# Patient Record
Sex: Male | Born: 2000 | Marital: Single
Health system: Northeastern US, Academic
[De-identification: ages and names within clinical notes are randomized; demographics above are authoritative.]

---
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
## 2019-03-14 IMAGING — US US SOFT TISSUE NECK BIL
1 series · 14 of 25 positions shown · non-contrast
Comparison: None.

HISTORY/INDICATIONS:  Enlarged upper neck lymph nodes.
TECHNIQUE: Gray-scale and color Doppler imaging of the bilateral anterior neck is performed.

[Series 1: us soft tissue neck bil · 14 of 26 slices shown]
[im 1/26]
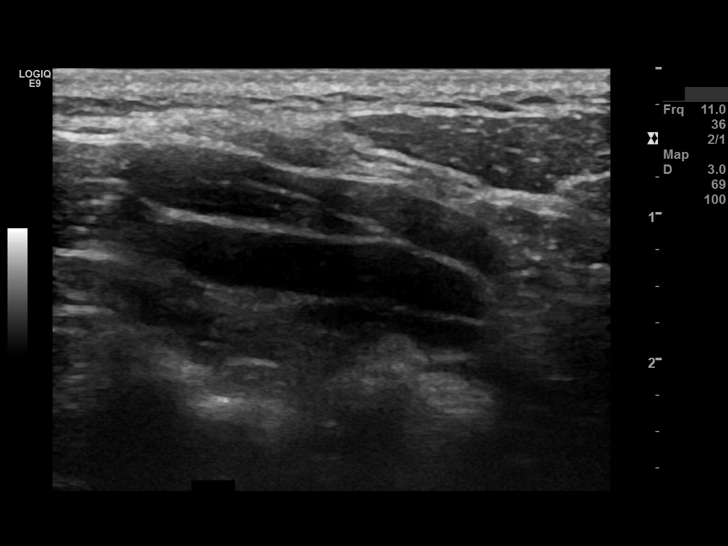
[im 3/26]
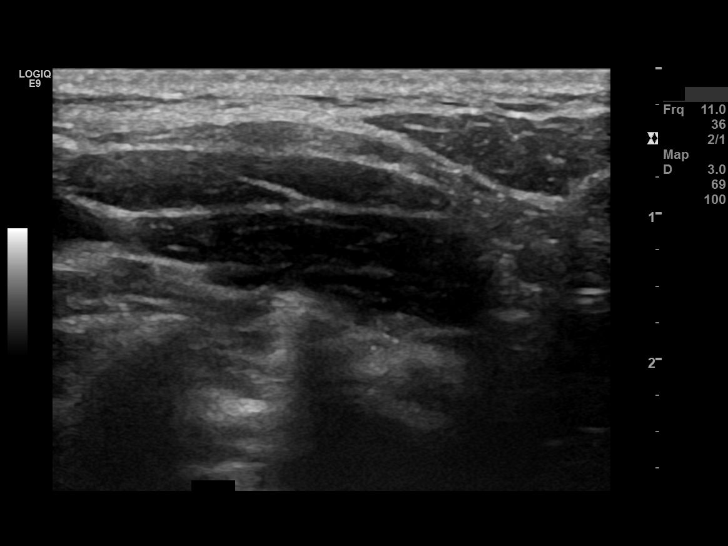
[im 5/26]
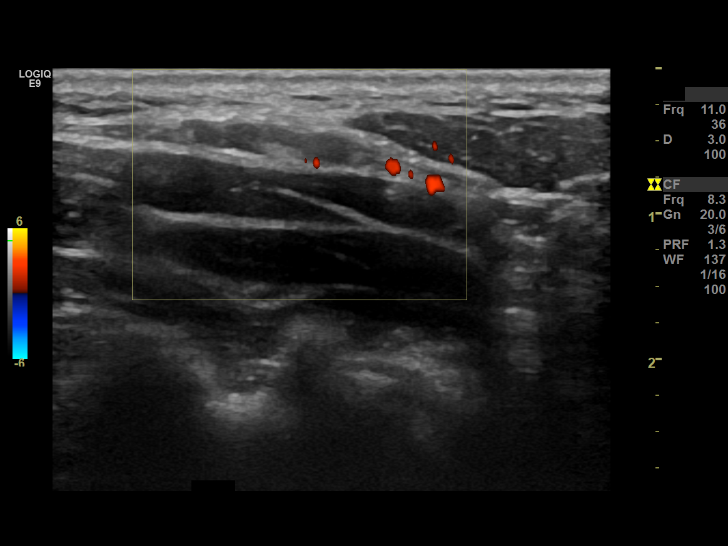
[im 7/26]
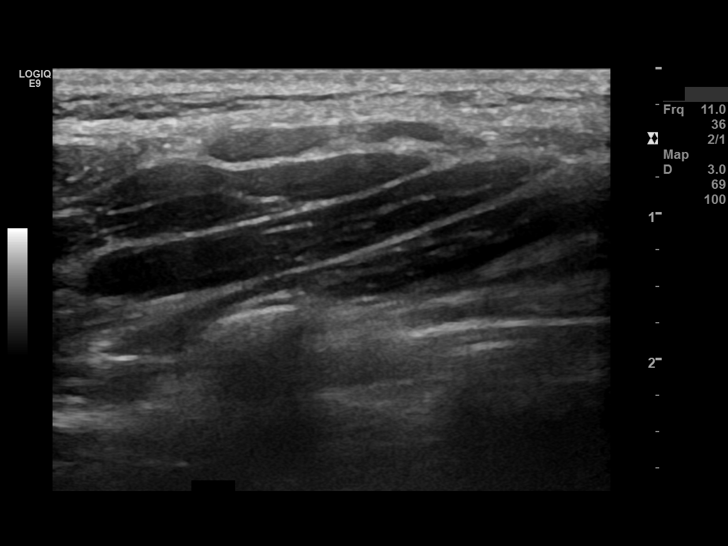
[im 9/26]
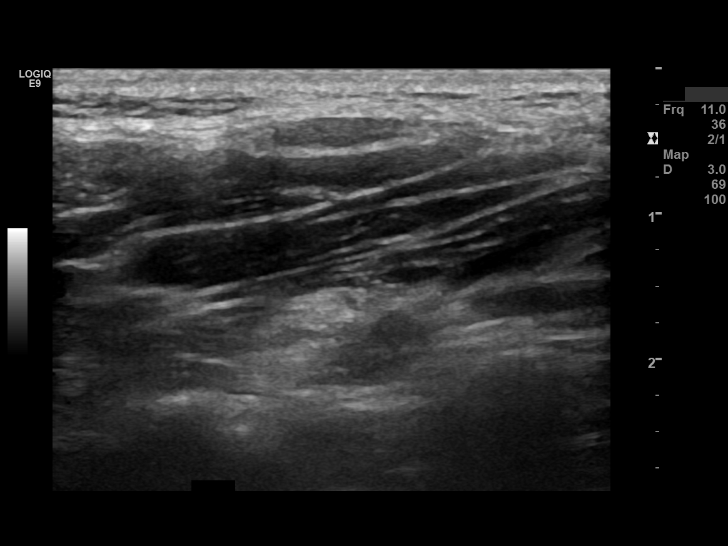
[im 10/26]
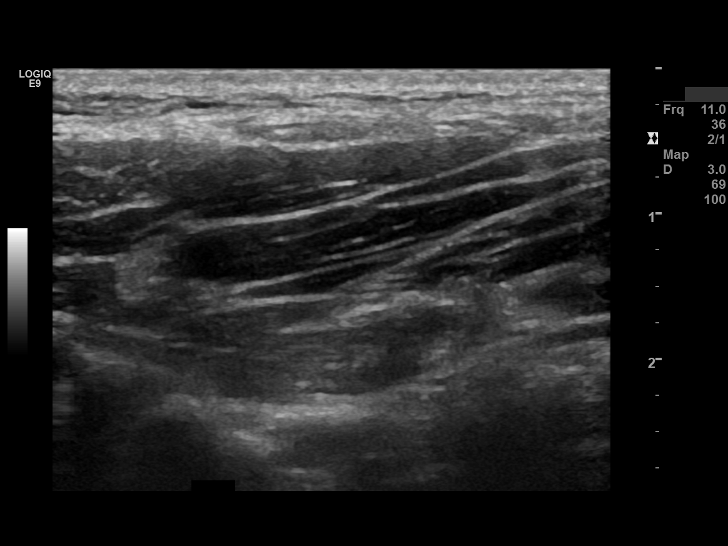
[im 12/26]
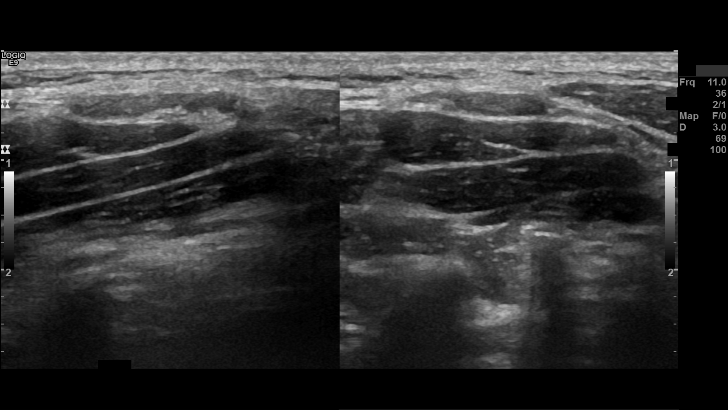
[im 14/26]
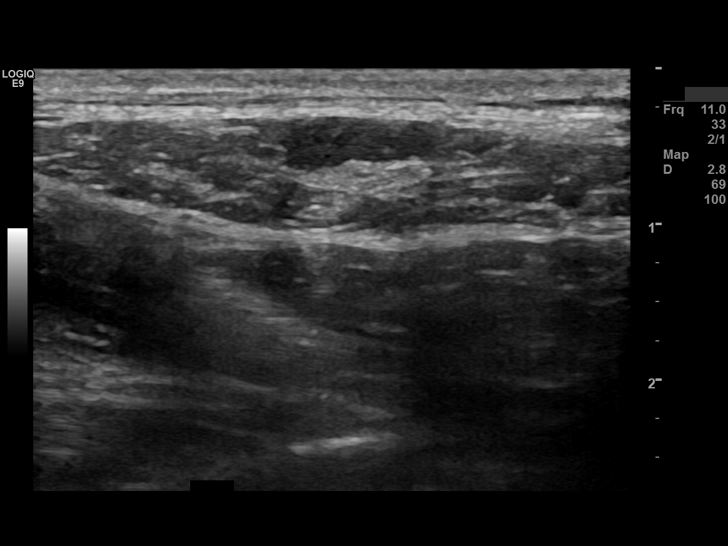
[im 16/26]
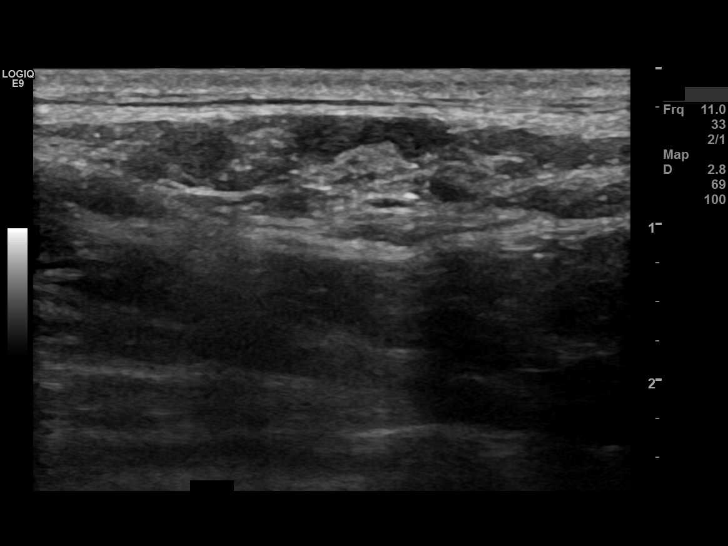
[im 17/26]
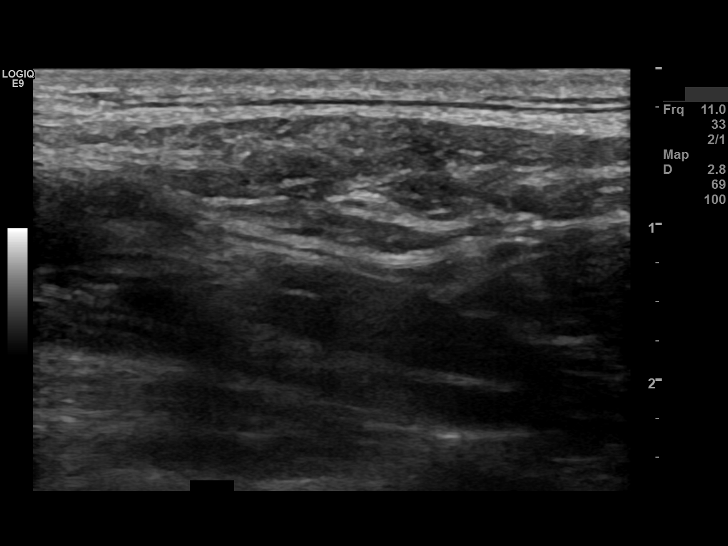
[im 19/26]
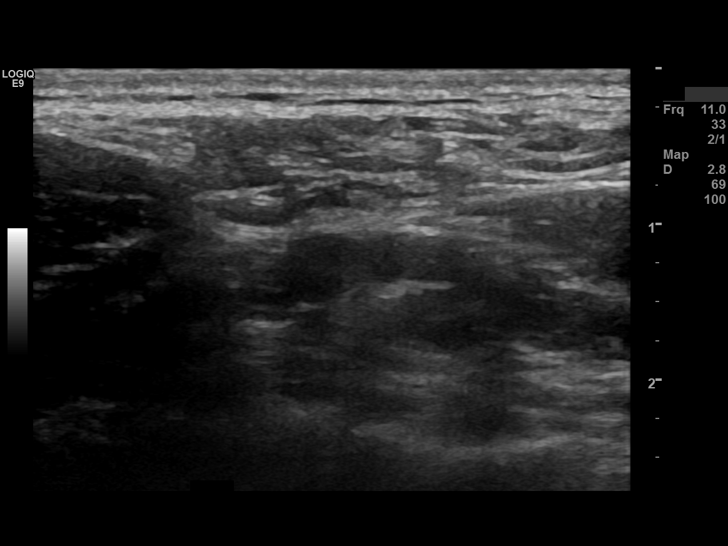
[im 21/26]
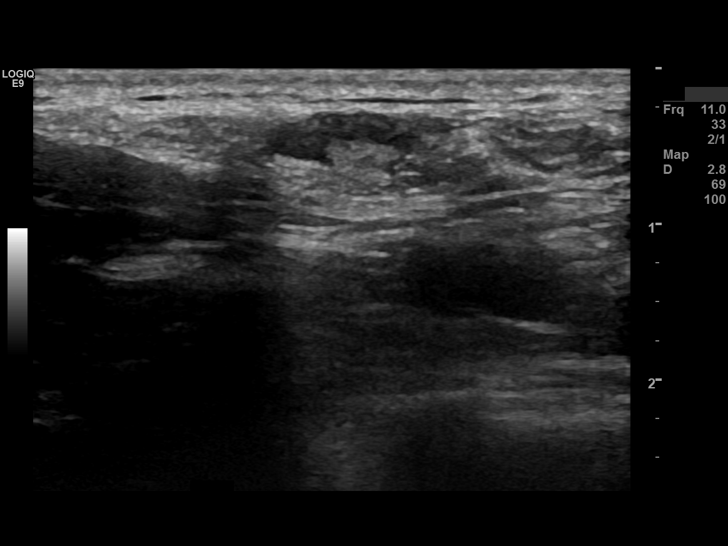
[im 23/26]
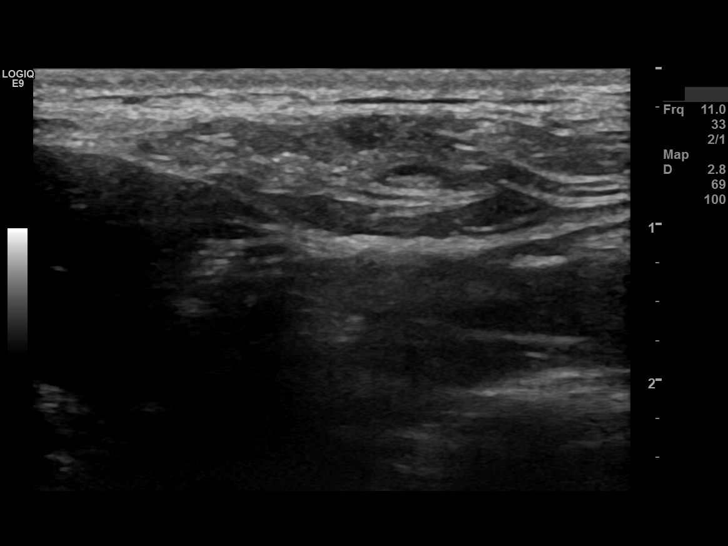
[im 26/26]
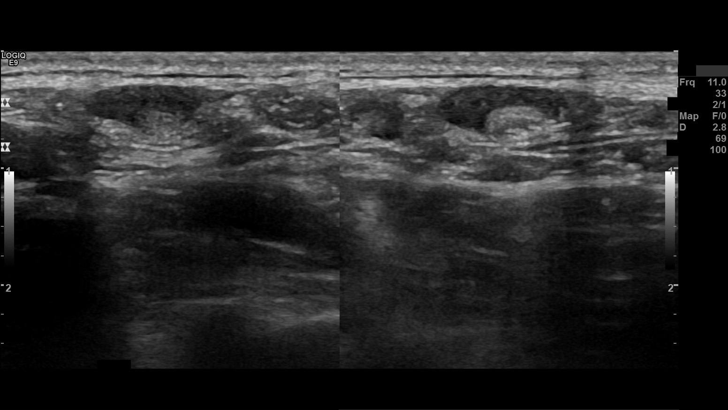

[14 of 25 positions shown; findings below may reference images not displayed]

FINDINGS: Zone V enlarged lymph nodes are noted bilaterally. One at the upper zone V of the right measures 15 x 3 x 8 mm. Left zone V lymph node in the upper neck has prominent fatty hilum and measures 10 x 5 x 11 mm. Both lymph nodes are likely reactive in appearance.
IMPRESSION: Mildly enlarged lymph nodes in the zone V of the upper neck bilaterally, likely reactive in nature and described above. No other adenopathy or neck abnormality is appreciated.

## 2021-12-02 ENCOUNTER — Other Ambulatory Visit: Admit: 2021-12-02 | Payer: PRIVATE HEALTH INSURANCE | Primary: Pediatrics

## 2021-12-02 DIAGNOSIS — D563 Thalassemia minor: Secondary | ICD-10-CM

## 2021-12-08 LAB — HEMOGLOBIN EVALUATION
Hematocrit: 46.3 % (ref 38.5–50.0)
Hemoglobin A2 (Quant): 2.6 % (ref 2.2–3.2)
Hemoglobin A: 97.4 % (ref >96.0)
Hemoglobin F: <1 % (ref <2.0)
Hemoglobin: 15.7 g/dL (ref 13.2–17.1)
MCH: 30.5 pg (ref 27.0–33.0)
MCV: 89.9 fL (ref 80.0–100.0)
RDW: 13.3 % (ref 11.0–15.0)
Red Blood Cell Count: 5.15 10*6/uL (ref 4.20–5.80)

## 2021-12-17 LAB — ALPHA THALASSEMIA 7 DELETIONS

## 2022-02-12 NOTE — Procedures (Signed)
Advised patient:    LMOM    - One visitor is allowed at this time  - NPO instructions given, NPO after midnight, may have clear liquids up to 3 hours prior to surgery (water, apple juice, gatorade, ginger ale)  - Shower or bathe morning of surgery  - No shaving near procedure site  - Arrange an escort home and family care overnight   -Neuro/back surgery: bring MRI CD   - Bring CPAP if appropriate  - Bring inhaler if appropriate  - Bring list of medications if appropriate  - Complete Bowel prep if appropriate  - Ensure Clear IF instructed   - Hibiclens shower IF instructed   - Medication instructions preprocedure  - Quit smoking to help prevent infections  - No make up, no jewelry, piercings, contact lenses   - Please do NOT bring any cash with you   - No valuables     - Wear comfortable clothing: outfit that is easy to take on and off

## 2022-02-15 MED ORDER — bacitracin 500 unit/gram ointment  - Omnicell Override Pull
500 | TOPICAL | Status: AC
Start: 2022-02-15 — End: ?

## 2022-02-15 MED ORDER — BUPivacaine HCl (Marcaine) 0.25 % (2.5 mg/mL) injection
0.25 | INTRAMUSCULAR | Status: DC | PRN
Start: 2022-02-15 — End: 2022-02-15
  Administered 2022-02-15: 14:00:00 19

## 2022-02-15 MED ORDER — bupivacaine PF (Marcaine) 0.25 % (2.5 mg/mL) injection  - Omnicell Override Pull
0.25 | INTRAMUSCULAR | Status: AC
Start: 2022-02-15 — End: ?

## 2022-02-15 MED FILL — BUPIVACAINE (PF) 0.25 % (2.5 MG/ML) INJECTION SOLUTION: 0.25 0.25 % (2.5 mg/mL) | INTRAMUSCULAR | Qty: 30

## 2022-02-15 MED FILL — BACITRACIN 500 UNIT/GRAM TOPICAL OINTMENT: 500 500 unit/gram | TOPICAL | Qty: 28.4

## 2022-02-15 NOTE — H&P (Signed)
I have seen and examined the patient and reviewed the prior H&P. No changes noted. The risks of surgery were again reviewed with the patient. All questions answered and informed consent obtained.

## 2022-02-15 NOTE — Discharge Instructions (Signed)
INCISION:   You may remove your dressing tomorrow.  Suture will be removed in the follow up visit  You have  dermabond ( skin glue) over incision. Follow Derma bond discharge sheet. Donot apply any lotions, bacitracin or Neosporin to site.  HYGIENE:   Daily showers with mild soap are preferred. Pat your incision dry. Do not soak in the tub. Do not apply creams or lotions to your incision unless instructed by your physician.   ACTIVITY:  Resume your normal activity gradually. Walking is good exercise.No heavy lifting till cleared by MD.   DIET/ELIMINATION:    Start on soft foods and increase your regular diet.  MEDICATION:   Read all labels carefully. If your doctor ordered pain medication, take as instructed. If you are not having pain, you do not need to take the medication. Do not drive while taking narcotics or tranquilizers. Do not drink alcohol while taking any medication.   Notify your doctor if you experience any of the following:    Temperature of 101 or greater    Your incisions become red, warm, or swollen.    Increased drainage from your incisions or drainage with a bad smell.    Increased pain or pain not relieved by your pain medication.    If you think you may be experiencing side effects of your medications such as rash, itching, nausea, or vomiting.       Recovery After Procedural Sedation (Adult)  You have been given medicine by vein to make you sleep during your procedure. This may have included both a pain medicine and sleeping medicine. Most of the effects have worn off. But you may still have some drowsiness for the next 6 to 8 hours.  Home care  Follow these guidelines when you get home:  For the next 8 hours, you should be watched by a responsible adult. This person should make sure your condition is not getting worse.  Don't drink any alcohol for the next 24 hours.  Don't drive, operate dangerous machinery, make important business or personal decisions, or sign legal documents during the  next 24 hours.  Note: Your healthcare provider may tell you not to take any medicine by mouth for pain or sleep in the next 4 hours. These medicines may react with the medicines you were given in the hospital. This could cause a much stronger response than usual.  Follow-up care  Follow up with your healthcare provider if you are not alert and back to your usual level of activity within 12 hours.  When to seek medical advice  Call your healthcare provider right away if any of these occur:  Drowsiness gets worse  Weakness or dizziness gets worse  Repeated vomiting  You can't be awakened      2000-2018 [employer], [place]. [address] [place], [place] [address]. All rights reserved. This information is not intended as a substitute for professional medical care. Always follow your healthcare professional's instructions.

## 2022-02-15 NOTE — Op Note (Signed)
Excision, Cyst, Abdomen, Or Flank (L), Excision, Mass, Soft Tissue, Lower Extremity (L) Operative Note     Date: 02/15/2022  [facility]    Name: Jacob BihariMiguel A Potts, DOB: 12/21/2000, MRN: [id number]    Diagnosis  Pre-op Diagnosis     * Sebaceous cyst [L72.3] Post-op Diagnosis     * Sebaceous cyst [L72.3]     Procedures  Excision, Cyst, Abdomen, Or Flank  11402 - PR EXC SKIN BENIG 1.1-2 CM TRUNK,ARM,LEG    Excision, Mass, Soft Tissue, Lower Extremity      Surgeons      * Elmyra Ricksaitlin Rayburn Mundis - Primary    Procedure Summary  Anesthesia: Local  ASA: ASA status not filed in the log.  Estimated Blood Loss: Minimal  Total IV Fluids: unknown mL  Drains: * None in log *   Specimens     ID Source Type Tests Collected By Collected At Frozen? Priority Lab ID    1 Abdomen, Lower Left Tissue  TISSUE PATHOLOGY   Elmyra Ricksaitlin Conrado Nance, MD 02/15/22 0800 No      Description: ABDOMINAL CYST    2 Thigh, Left Tissue  TISSUE PATHOLOGY   Elmyra Ricksaitlin Alontae Chaloux, MD 02/15/22 0800       Description: LEFT INNER THIGH MASS         Staff:   Circulator: Jacqualine CodeAnastasia Oeung, RN  Scrub Person: Fleeta EmmerMarc Maggio    Indications: Jacob BihariMiguel A Sagar is an 21 y.o. male who is having surgery for Abdominal wall cyst.       Procedure Details:  The patient was seen in the preoperative area. The risks, benefits, complications, treatment options, non-operative alternatives, expected recovery and outcomes were discussed with the patient. The possibilities of reaction to medication, pulmonary aspiration, injury to surrounding structures, bleeding, recurrent infection, the need for additional procedures, failure to diagnose a condition, and creating a complication requiring transfusion or operation were discussed with the patient. The patient concurred with the proposed plan, giving informed consent. The site of surgery was properly noted/marked if necessary per policy. The patient has been actively warmed in preoperative area. Preoperative antibiotics are not indicated. Venous  thrombosis prophylaxis are not indicated.    The patient was taken to the operating room and placed in the supine position. The patient's left lower abdomen was sterilely prepped and draped in the standard surgical fashion and a time out was performed.    Lidocaine 1% was infiltrated locally into the skin around the mass. An elliptical incision including the punctum of the cyst was then made horizontally at the midpoint of the mass. The incision was carried down through the skin to the subcutaneous tissue using sharp dissection. A combination of blunt, sharp, and electrocautery dissection was then utilized to dissect the mass out from the surrounding tissue. Once the mass was completely excised hemostasis was obtained and the wound was irrigated. The wound was then reapproximated with deep interrupted 3-0 vicryl sutures and closed with 4-0 vicryl subcutaneously. The incision was cleaned and dried and a dressing applied.     Attention was then turned to the left medial thigh. The area was sterilely prepped and draped. Lidocaine 1% was infiltrated locally into the skin around the mass. An elliptical incision including the punctum of the cyst was then made horizontally at the midpoint of the mass. The incision was carried down through the skin to the subcutaneous tissue using sharp dissection. A combination of blunt, sharp, and electrocautery dissection was then utilized to dissect the mass out from the  surrounding tissue. Once the mass was completely excised hemostasis was obtained and the wound was irrigated. The wound was then closed with interrupted 3-0 nylon sutures. The incision was cleaned and dried and a dressing applied. The patient was then transported to the recovery room in stable condition. All instrument, sponge, and needle counts were correct at the end of the case.    Findings: epidermal inclusion cyst of left lower abdominal wall, follicular cyst of left medial thigh    Complications:  None; patient  tolerated the procedure well.     Disposition: PACU - hemodynamically stable.  Condition: stable        Elmyra Ricks  Phone Number: [phone]

## 2022-02-16 LAB — TISSUE PATHOLOGY

## 2022-04-13 IMAGING — MR MRI BRAIN W/WO CONTRAST
12 series · 46 of 48 positions shown · IV contrast (prohance)
Comparison: None.

Images Obtained from [HOSPITAL] Imaging
HISTORY: 21 years-old Male with Headache.
TECHNIQUE: Pre- and postcontrast enhanced  MRI study of the brain was performed using sagittal, coronal and axial images of varying sequences.
IV contrast:  15 cc ProHance.

[Series 5: flair_axial fs · axial · 4.0mm · 0.42mm/px · z∈[-67,+92]mm · 2 of 32 slices shown]
[im 1/32]
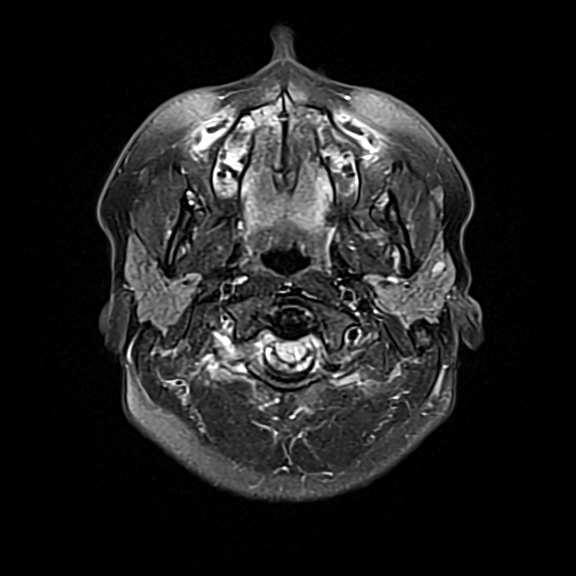
[im 32/32]
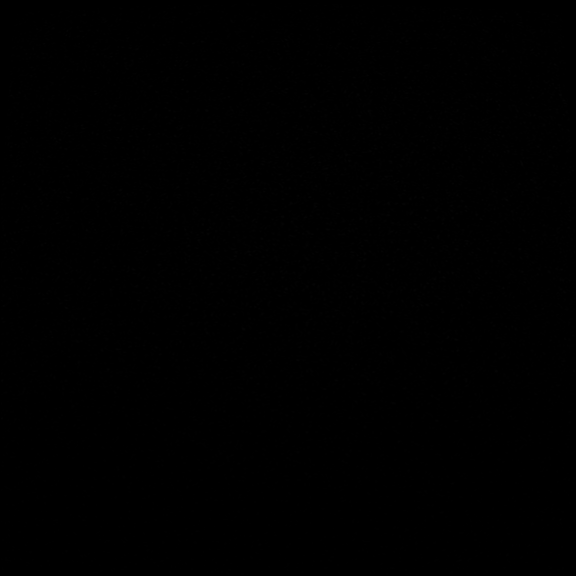

[Series 6: T2 · axial · 4.0mm · 0.34mm/px · z∈[-67,+92]mm · 2 of 32 slices shown]
[im 1/32]
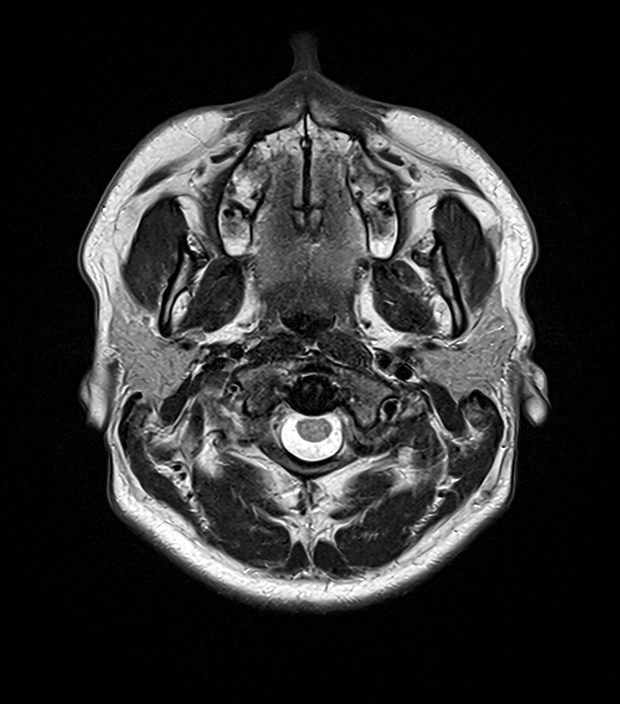
[im 32/32]
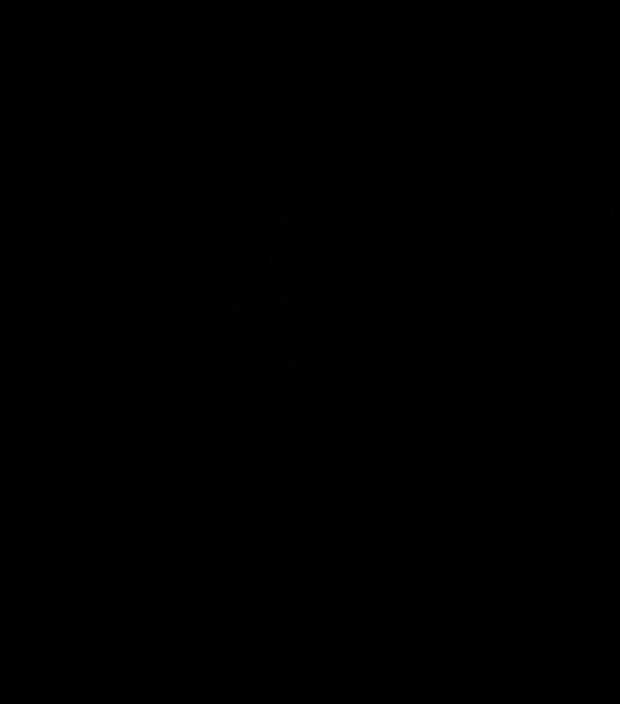

[Series 7: DWI · axial · 4.0mm · 1.36mm/px · 1 of 32 slices shown (1 of 2)]
[im 1/32]
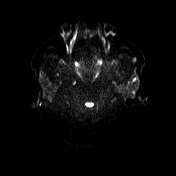

[Series 8: DWI · axial · 4.0mm · 1.36mm/px · 1 of 31 slices shown (2 of 2)]
[im 1/31]
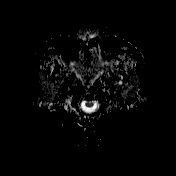

[Series 9: flash_axial · axial · 4.0mm · 0.94mm/px · 1 of 32 slices shown]
[im 1/32]
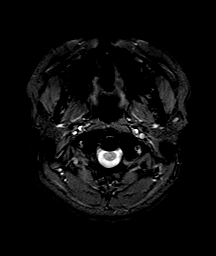

[Series 10: t1_mprage axial · axial · 1.0mm · 0.83mm/px · z∈[-71,+91]mm · 6 of 166 slices shown]
[im 1/166]
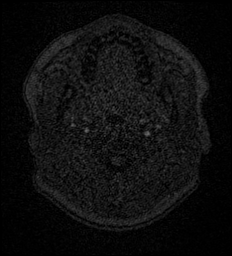
[im 34/166]
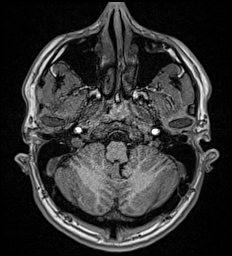
[im 67/166]
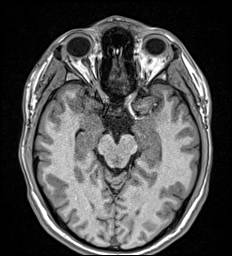
[im 100/166]
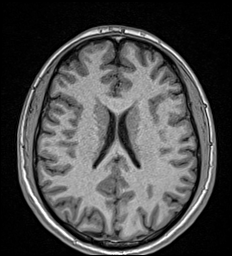
[im 133/166]
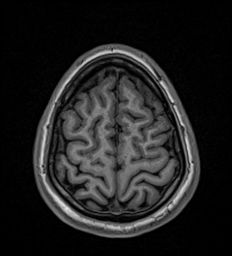
[im 166/166]
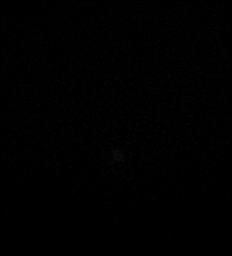

[Series 11: t1_mprage axial_mpr_mprage sag · sagittal · 1.0mm · 0.83mm/px · 6 of 166 slices shown]
[im 1/166]
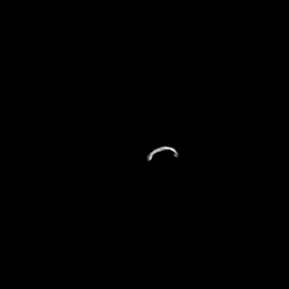
[im 34/166]
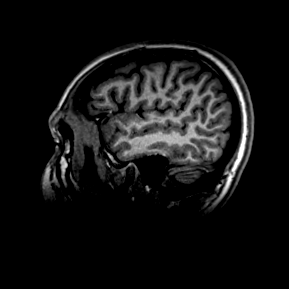
[im 67/166]
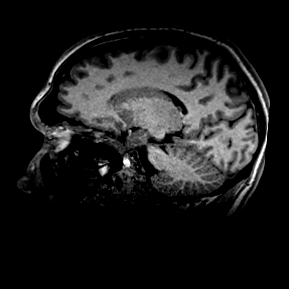
[im 100/166]
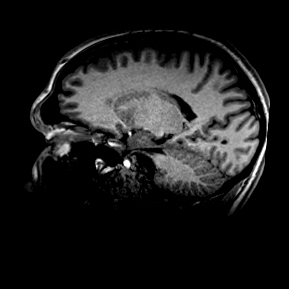
[im 133/166]
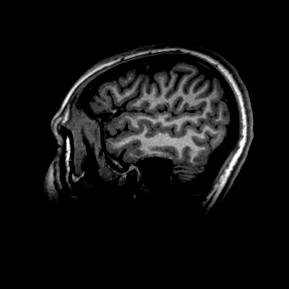
[im 166/166]
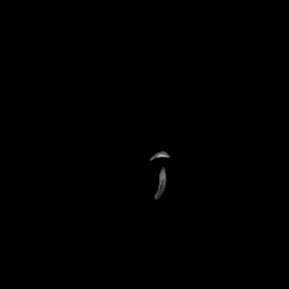

[Series 12: t1_mprage axial_mpr_mprage cor · coronal · 1.0mm · 0.83mm/px · 8 of 202 slices shown]
[im 1/202]
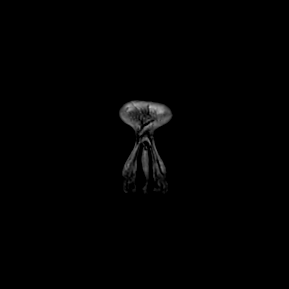
[im 29/202]
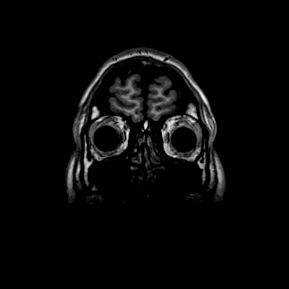
[im 58/202]
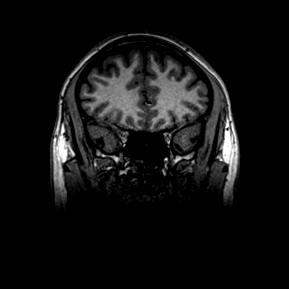
[im 87/202]
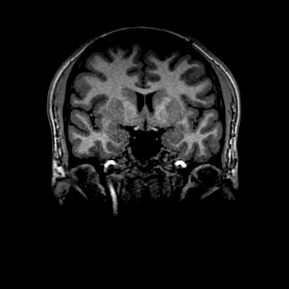
[im 115/202]
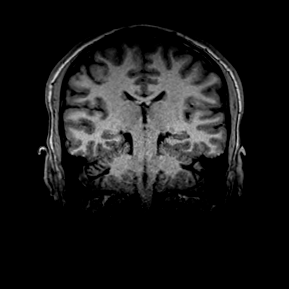
[im 144/202]
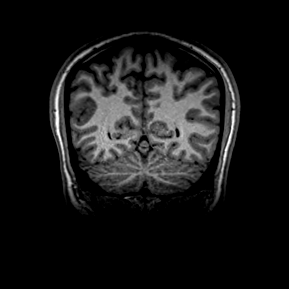
[im 173/202]
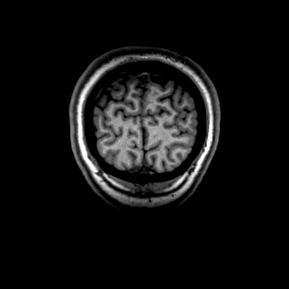
[im 202/202]
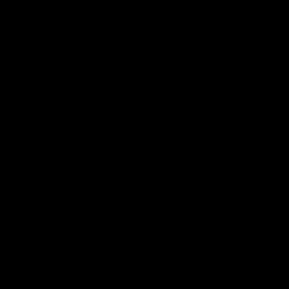

[Series 14: resolve_(id)_(person_name)_(person_name)_(person_name)[id number] · axial · 4.0mm · 0.69mm/px · 1 of 28 slices shown]
[im 1/28]
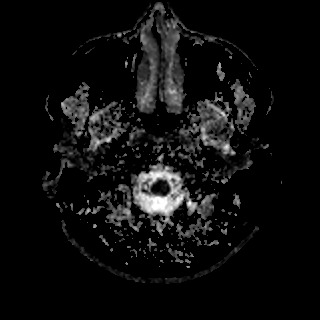

[Series 15: t1_mprage axial+c · axial · 1.0mm · 0.83mm/px · z∈[-71,+100]mm · 6 of 167 slices shown]
[im 1/167]
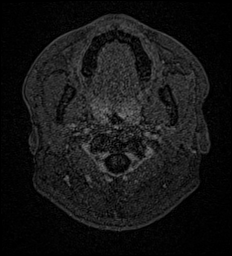
[im 34/167]
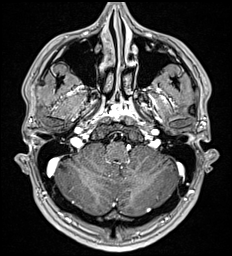
[im 67/167]
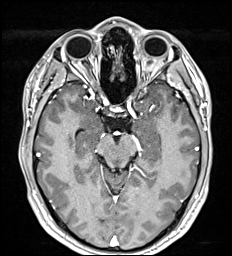
[im 100/167]
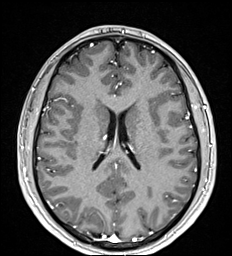
[im 133/167]
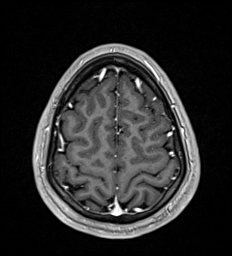
[im 167/167]
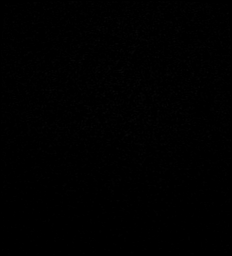

[Series 16: t1_mprage axial+c_mpr_mprage cor · coronal · 1.0mm · 0.83mm/px · 8 of 202 slices shown]
[im 1/202]
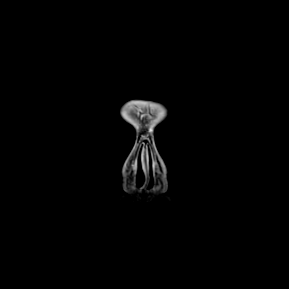
[im 29/202]
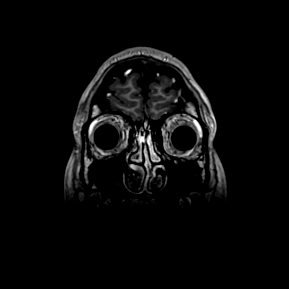
[im 58/202]
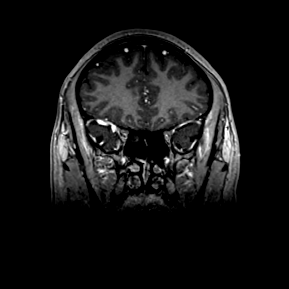
[im 87/202]
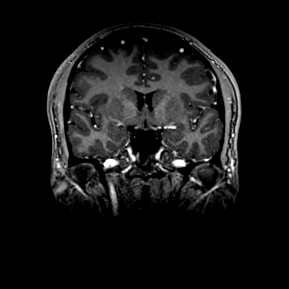
[im 115/202]
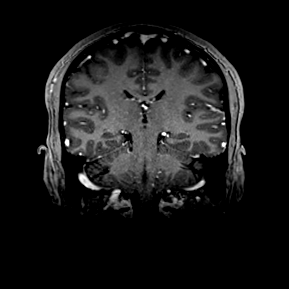
[im 144/202]
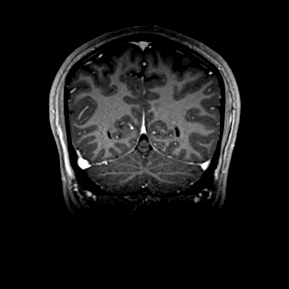
[im 173/202]
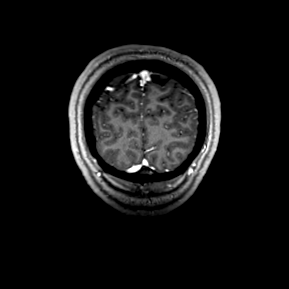
[im 202/202]
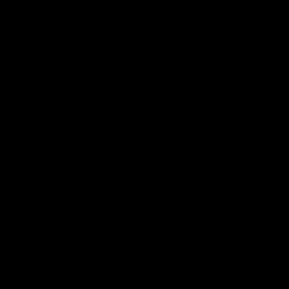

[Series 17: t1_mprage axial+c_mpr_mprage sag · sagittal · 1.0mm · 0.83mm/px · 4 of 168 slices shown]
[im 1/168]
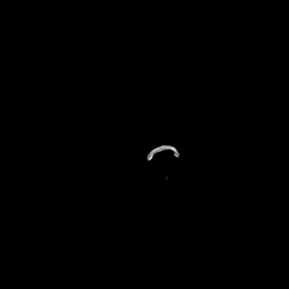
[im 34/168]
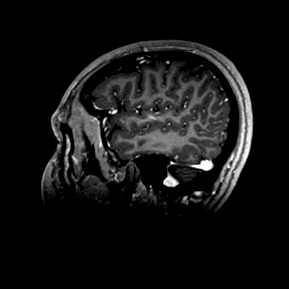
[im 67/168]
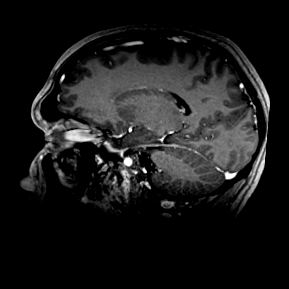
[im 101/168]
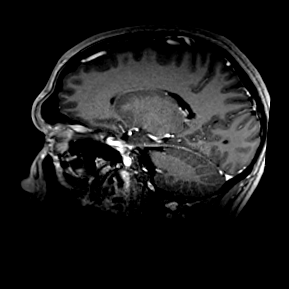

[46 of 48 positions shown; findings below may reference images not displayed]

FINDINGS: Ventricles and sulci: The ventricles and sulci are within normal limits.
Gray/white matter: The gray/white matter differentiation is unremarkable.
Brain stem, cerebellum and midline structures: The brain stem, cerebellum and the midline structures are within normal limits.
Flow voids: Flow voids are demonstrated in the major intracranial vessels. The distal right vertebral artery is tiny, though
Sinuses and mastoid air cells: The sinuses and the mastoid air cells are within normal limits.
Globes: The globes are unremarkable.
There is no MR evidence of acute infarct, hemorrhage or enhancing intracranial lesion.
IMPRESSION: Normal MRI examination of the brain.

## 2022-06-10 ENCOUNTER — Encounter: Payer: PRIVATE HEALTH INSURANCE | Primary: Pediatrics

## 2024-05-10 ENCOUNTER — Inpatient Hospital Stay
Admit: 2024-05-10 | Discharge: 2024-05-11 | Disposition: A | Discharge status: Home / Self Care | Payer: PRIVATE HEALTH INSURANCE | Arrived: VH | Attending: Emergency Medicine

## 2024-05-10 DIAGNOSIS — L02211 Cutaneous abscess of abdominal wall: Principal | ICD-10-CM

## 2024-05-10 LAB — COMPREHENSIVE METABOLIC PANEL
ALT: 31 U/L (ref 0–55)
AST: 16 U/L (ref 6–42)
Albumin: 4.1 g/dL (ref 3.2–5.0)
Alkaline phosphatase: 118 U/L (ref 30–130)
Anion Gap: 6 mmol/L (ref 3–14)
BUN: 11 mg/dL (ref 6–24)
Bilirubin, total: 0.3 mg/dL (ref 0.2–1.2)
CO2 (Bicarbonate): 24 mmol/L (ref 20–32)
Calcium: 9.6 mg/dL (ref 8.5–10.5)
Chloride: 109 mmol/L (ref 98–110)
Creatinine: 1.22 mg/dL (ref 0.55–1.30)
Glucose: 110 mg/dL (ref 70–110)
Potassium: 3.9 mmol/L (ref 3.6–5.2)
Protein, total: 8.3 g/dL (ref 6.0–8.4)
Sodium: 139 mmol/L (ref 135–146)
eGFRcr: 85 mL/min/{1.73_m2} (ref >=60)

## 2024-05-10 LAB — CBC WITH DIFFERENTIAL
Basophils %: 0.3 %
Basophils Absolute: 0.03 10*3/uL (ref 0.00–0.22)
Eosinophils %: 2.7 %
Eosinophils Absolute: 0.25 10*3/uL (ref 0.00–0.50)
Hematocrit: 47.2 % (ref 37.0–53.0)
Hemoglobin: 16.3 g/dL (ref 13.0–17.5)
Immature Granulocytes %: 0.3 %
Immature Granulocytes Absolute: 0.03 10*3/uL (ref 0.00–0.10)
Lymphocyte %: 20.1 %
Lymphocytes Absolute: 1.89 10*3/uL (ref 0.70–4.00)
MCH: 30.9 pg (ref 26.0–34.0)
MCHC: 34.5 g/dL (ref 31.0–37.0)
MCV: 89.6 fL (ref 80.0–100.0)
MPV: 10.8 fL (ref 9.1–12.4)
Monocytes %: 7.9 %
Monocytes Absolute: 0.74 10*3/uL (ref 0.38–0.83)
NRBC %: 0 % (ref 0.0–0.0)
NRBC Absolute: 0 10*3/uL (ref 0.00–2.00)
Neutrophil %: 68.7 %
Neutrophils Absolute: 6.44 10*3/uL (ref 1.50–7.95)
Platelets: 214 10*3/uL (ref 150–400)
RBC: 5.27 M/uL (ref 4.20–5.90)
RDW-CV: 12.8 % (ref 11.5–14.5)
RDW-SD: 42.2 fL (ref 35.0–51.0)
WBC: 9.4 10*3/uL (ref 4.0–11.0)

## 2024-05-10 LAB — LIGHT BLUE TOP

## 2024-05-10 LAB — RAINBOW DRAW SST GOLD TOP

## 2024-05-10 MED ORDER — LIDOCAINE 1 %-EPINEPHRINE 1:100,000 INJECTION SOLUTION
1 | Freq: Once | INTRAMUSCULAR | Status: AC
Start: 2024-05-10 — End: 2024-05-10
  Administered 2024-05-10: 23:00:00 5 mL

## 2024-05-10 MED FILL — LIDOCAINE 1 %-EPINEPHRINE 1:100,000 INJECTION SOLUTION: 1 1 %-1:100,000 | INTRAMUSCULAR | Qty: 20 | Fill #0

## 2024-05-10 NOTE — Patient Pass (Signed)
 Patient Education  Table of Contents   Skin Infection (Cellulitis) in Adults: What to Know   Draining a Pocket of Fluid on the Skin (Incision and Drainage): What to Know After   Infected Bump on the Skin (Skin Abscess): What to Know    To view videos and all your education online visit,  [email or web address].elsevier.com/kgbvtaYy  or scan this QR code with your smartphone.  Access to this content will expire in one year.  Skin Infection (Cellulitis) in Adults: What to Know    Cellulitis is a skin infection. The infected area is often warm, red, swollen, and sore. It occurs most often on the legs, feet, and toes, but can happen on any part of the body.  This condition can be life-threatening without treatment. It is very important to get treated right away.  What are the causes?  This condition is caused by bacteria. The bacteria enter through a break in the skin, such as:   A cut.   A burn.   A bug bite.   An animal bite.   An open sore.   A crack.  What increases the risk?   Having a weak body's defense system (immune system).   Being older than 25 years old.   Having a blood sugar problem (diabetes).   Having a long-term liver disease (cirrhosis) or kidney disease.   Being very overweight (obese).   Having a skin problem, such as:  ? An itchy rash.  ? A rash caused by a fungus.  ? A rash with blisters.  ? Slow movement of blood in the veins (venous stasis).  ? Fluid buildup below the skin (edema).  This condition is more likely to occur in people who:   Have open cuts, burns, bites, or scrapes on the skin.   Have been treated with high-energy rays (radiation).   Use IV drugs.   What are the signs or symptoms?   Skin that:  ? Looks red or purple, or slightly darker than your usual skin color.  ? Has streaks.  ? Has spots.  ? Is swollen.  ? Is sore or painful when you touch it.  ? Is warm.   A fever.   Chills.    Blisters.   Tiredness (fatigue).  How is this treated?   Medicines to treat infections or  allergies.   Rest.   Placing cold or warm cloths on the skin.   Staying in the hospital, if the condition is very bad. You may need medicines through an IV.  Follow these instructions at home:  Medicines   Take over-the-counter and prescription medicines only as told by your doctor.   If you were prescribed antibiotics, take them as told by your doctor. Do not stop using them even if you start to feel better.  General instructions   Drink enough fluid to keep your pee (urine) pale yellow.   Do not touch or rub the infected area.   Raise (elevate) the infected area above the level of your heart while you are sitting or lying down.   Return to your normal activities when your doctor says that it is safe.   Place cold or warm cloths on the area as told by your doctor.   Keep all follow-up visits. Your doctor will need to make sure that a more serious infection is not developing.  Contact a doctor if:   You have a fever.   You do not start to get better after 1?2  days of treatment.   Your bone or joint under the infected area starts to hurt after the skin has healed.   Your infection comes back in the same area or another area. Signs of this may include:  ? You have a swollen bump in the area.  ? Your red area gets larger, turns dark in color, or hurts more.  ? You have more fluid coming from the wound.  ? Pus or a bad smell develops in your infected area.  ? You have more pain.   You feel sick and have muscle aches and weakness.   You develop vomiting or watery poop that will not go away.  Get help right away if:   You see red streaks coming from the area.   You notice the skin turns purple or black and falls off.  These symptoms may be an emergency. Get help right away. Call 911.   Do not wait to see if the symptoms will go away.   Do not drive yourself to the hospital.  This information is not intended to replace advice given to you by your health care provider. Make sure you discuss any questions you have with your  health care provider.  Document Released: 2007-09-08 Document Updated: 2024-01-27 Document Reviewed: 2021-11-17  Elsevier Patient Education ? 2025 [employer]  Draining a Pocket of Fluid on the Skin (Incision and Drainage): What to Know After  After incision and drainage, it is common to have:   Pain or discomfort around the incision site.   Blood, fluid, or pus (drainage) from the incision.   Redness and firm skin around the incision site.  Follow these instructions at home:  Medicines   Take over-the-counter and prescription medicines only as told by your health care provider.   If you were prescribed antibiotics, take them as told by your provider. Do not stop using the antibiotic even if you start to feel better.   Do not apply creams, ointments, or liquids unless you have been told to by your provider.  Wound care    Follow instructions from your provider about how to take care of your wound. Make sure you:   Wash your hands with soap and water for at least 20 seconds before and after you change your bandage (dressing). If soap and water are not available, use hand sanitizer.   Change your dressing and any packing as told by your provider.  ? If the dressing is dry or stuck when you try to remove it, moisten or wet it with saline or water. This will help you remove it without harming your skin or tissues.  ? If your wound is packed, leave it in place until your provider tells you to remove it. To remove it, moisten or wet the packing with saline or water.   Leave stitches (sutures), skin glue, or tape strips in place. These skin closures may need to stay in place for 2 weeks or longer. If tape strip edges start to loosen and curl up, you may trim the loose edges. Do not remove tape strips completely unless your provider tells you to do that.  Check your wound every day for signs of infection. Check for:   More redness, swelling, or pain.   More fluid or blood.   Warmth.   Pus or a bad smell.  If you were sent  home with a drain tube in place, follow instructions from your provider about:   How to empty it.  How to care for it at home.  Be careful when you get rid of used dressings, wound packing, or drainage.  Activity   Rest the affected area.   Return to your normal activities as told by your provider. Ask your provider what activities are safe for you.  General instructions   Do not use any products that contain nicotine or tobacco. These products include cigarettes, chewing tobacco, and vaping devices, such as e-cigarettes. These can delay incision healing after surgery. If you need help quitting, ask your provider.   Do not take baths, swim, or use a hot tub until your provider approves. Ask your provider if you may take showers. You may only be allowed to take sponge baths.   The incision will keep draining. It is normal to have some clear or slightly bloody drainage. The amount of drainage should go down each day.   Keep all follow-up visits. Your provider will need to make sure that your incision is healing well and that there are no problems.  Your health care provider may give you more instructions. Make sure you know what you can and cannot do  Contact a health care provider if:   Your cyst or abscess comes back.   You have any signs of infection.   You notice red streaks that spread away from the incision site.   You have a fever or chills.  Get help right away if:   You have severe pain or bleeding.   You become short of breath.   You have chest pain.   You have signs of a severe infection. You may notice changes in your incision area, such as:  ? Swelling that makes the skin feel hard.  ? Numbness or tingling.  ? Sudden increase in redness. Your skin color may change from red to purple, and then to dark spots.  ? Blisters, ulcers, or splitting of the skin.  These symptoms may be an emergency. Get help right away. Call 911.   Do not wait to see if the symptoms will go away.   Do not drive yourself to the  hospital.  This information is not intended to replace advice given to you by your health care provider. Make sure you discuss any questions you have with your health care provider.  Document Released: 2011-06-14 Document Updated: 2024-01-31 Document Reviewed: 2021-11-09  Elsevier Patient Education ? 2025 [employer]  Infected Bump on the Skin (Skin Abscess): What to Know    A skin abscess is an infected spot of skin. It can have pus in it. An abscess can happen in any part of your body.  Some abscesses break open (rupture) on their own. Most keep getting worse unless they are treated. If your abscess is not treated, the infection can spread deeper into your body and blood. This can make you feel sick.  What are the causes?   Germs that enter your skin. This may happen if you have:  ? A cut or scrape.  ? A wound from a needle or an insect bite.  ? Blocked oil or sweat glands.  ? A problem with the spot where your hair goes into your skin.  ? A fluid-filled sac called a cyst under your skin.  What increases the risk?   Having problems with how your blood moves through your body.   Having a weak body defense system (immune system).   Having diabetes.   Having dry and irritated skin.   Needing  to get shots often.   Putting drugs into your body with a needle.   Having a splinter or something else in your skin.   Smoking.  What are the signs or symptoms?   A firm bump under your skin that hurts.   A bump with pus at the top.   Redness and swelling.   Warm or tender spots.   A sore on the skin.  How is this treated?  You may need to:   Put a heat pack or a warm, wet washcloth on the spot.   Have the pus drained.   Take antibiotics.  Follow these instructions at home:  Medicines   Take over-the-counter and prescription medicines only as told by your doctor.   If you were prescribed antibiotics, take them as told by your doctor. Do not stop taking them even if you start to feel better.  Abscess care     If you have an  abscess that has not drained, put heat on it. Use the heat source that your doctor recommends, such as a moist heat pack or a heating pad.  ? Place a towel between your skin and the heat source.  ? Leave the heat on for 20?30 minutes.  ? If your skin turns bright red, take off the heat right away to prevent burns. The risk of burns is higher if you cannot feel pain, heat, or cold.   Follow instructions from your doctor about how to take care of your abscess. Make sure you:  ? Cover the abscess with a bandage.  ? Wash your hands with soap and water for at least 20 seconds before and after you change your bandage. If you cannot use soap and water, use hand sanitizer.  ? Change your bandage as told by your doctor.   Check your abscess every day for signs that the infection is getting worse. Check for:  ? More redness, swelling, or pain.  ? More fluid or blood.  ? Warmth.  ? More pus or a worse smell.  General instructions   To keep the infection from spreading:  ? Do not share personal items or towels.   ? Do not go in a hot tub with others.  ? Avoid making skin contact with others.  ? Be careful when you get rid of used bandages or any pus from the abscess.   Do not smoke or use any products that contain nicotine or tobacco. If you need help quitting, ask your doctor.  Contact a doctor if:   You see red streaks on your skin near the abscess.   You have any signs of worse infection.   You vomit every time you eat or drink.   You have a fever, chills, or muscle aches.   The cyst or abscess comes back.  Get help right away if:   You have very bad pain.   You make less pee (urine) than normal.  This information is not intended to replace advice given to you by your health care provider. Make sure you discuss any questions you have with your health care provider.  Document Released: 2007-09-08 Document Updated: 2024-02-02 Document Reviewed: 2021-11-04  Elsevier Patient Education ? 2025 [employer].

## 2024-05-10 NOTE — Discharge Instructions (Addendum)
 Call the surgeons office tomorrow to make close follow-up for your abscess reevaluation.  If they cannot see you until next week you should go to urgent care over the weekend to get a wound recheck and possibly have the wick removed on the Saturday or Sunday    Keep an eye on the outline, if the redness is spreading beyond this despite antibiotics, you have fevers you should return to the ER

## 2024-05-10 NOTE — ED Provider Notes (Signed)
 [facility]  [address]  [place] [address]  05/10/2024  6:00 PM     PATIENT  Jacob Potts  DOB: 03-19-2001, MRN: [id number]  History source: Patient, medical records  Arrival: Private vehicle  History limitation: None      CHIEF COMPLAINT  Jacob Potts is a 24 y.o. male who presents to the ER for Cyst      HISTORY OF PRESENT ILLNESS  HPI  The patient is a 24 y.o. male  has no past medical history on file. who presents to the ER for evaluation of Cyst  This is a 24 year old male with a history of skin abscesses requiring drainage, here with an abscess and worsening redness on the right lower abdomen.  Patient went to an urgent care and was prescribed Bactrim, has been taking this for about 3 days without any improvement and actually the redness is spreading.  He denies fevers or chills.  There is a fluctuant area in the middle of the red area that he feels likely needs to be drained.      REVIEW OF SYSTEMS  Review of Systems   Skin:  Positive for rash.     A 10 point review of systems was otherwise negative other than pertinent positives and negatives as noted in HPI, including Const, Head, Neck, HENT, CV, Pulm, GI, GU, Skin, MSK, Neuro, Psych.     HEALTH STATUS  Allergies[1]   Immunization History   Administered Date(s) Administered    Covid-19 Moderna vaccine monovalent (12y+) 07/30/2019, 08/28/2019          PATIENT HISTORY  MEDICAL PROBLEMS  Medical History[2]  SURGERIES  Surgical History[3]  FAMILY HISTORY  Family History[4]  SOCIAL HISTORY  Social History     Tobacco Use    Smoking status: Never    Smokeless tobacco: Never   Substance Use Topics    Alcohol use: Not Currently    Drug use: Yes     Types: Marijuana       PHYSICAL EXAM  FIRST VITAL SIGNS  Temp: 36.5 C (97.7 F) Oral  Pulse: 71  BP: (!) 145/80  Resp: 18  SpO2: 99 % Oxygen Therapy: None (Room air)  Glasgow Coma Scale Score: 15    Physical Exam  Abdominal:        Comments: Central area of fluctuance and induration  with surrounding erythema up to the umbilicus           MEDICAL DECISION MAKING  Medical Decision Making  Abscess, cellulitis, will ultrasound and plan for incision and drainage    Problems Addressed:  Cellulitis of right abdominal wall: complicated acute illness or injury  Cutaneous abscess of abdominal wall: complicated acute illness or injury    Amount and/or Complexity of Data Reviewed  Labs: ordered.    Risk  Prescription drug management.       Reviewed and confirmed history obtained in nursing triage notes regarding PMH and chart review performed of prior records as available to obtain additional history, past social history, and past family history as needed.      RESULTS  No orders to display          Labs Reviewed   WOUND/AEROBIC CULTURE AND GRAM STAIN   CBC W/DIFF    Narrative:     The following orders were created for panel order CBC and differential.  Procedure  Abnormality         Status                     ---------                               -----------         ------                     CBC w/ Differential[[id number]]                              Final result                 Please view results for these tests on the individual orders.   COMPREHENSIVE METABOLIC PANEL       Result Value    Sodium 139      Potassium 3.9      Chloride 109      CO2 (Bicarbonate) 24      Anion Gap 6      BUN 11      Creatinine 1.22      eGFRcr 85      Glucose 110      Fasting? Unknown      Calcium 9.6      AST 16      ALT 31      Alkaline phosphatase 118      Protein, total 8.3      Albumin 4.1      Bilirubin, total 0.3     CBC WITH DIFFERENTIAL    WBC 9.4      RBC 5.27      Hemoglobin 16.3      Hematocrit 47.2      MCV 89.6      MCH 30.9      MCHC 34.5      RDW-CV 12.8      RDW-SD 42.2      Platelets 214      MPV 10.8      Neutrophil % 68.7      Lymphocyte % 20.1      Monocytes % 7.9      Eosinophils % 2.7      Basophils % 0.3      Immature Granulocytes % 0.3      NRBC % 0.0       Neutrophils Absolute 6.44      Lymphocytes Absolute 1.89      Monocytes Absolute 0.74      Eosinophils Absolute 0.25      Basophils Absolute 0.03      Immature Granulocytes Absolute 0.03      NRBC Absolute 0.00        Labs were reviewed independently by me and labs are grossly unremarkable with a normal white count    No orders to display             ED TREATMENTS  Medications   lidocaine -epinephrine  (Xylocaine  W/EPI) 1 %-1:100,000 injection 5 mL (5 mL infiltration Given 05/10/24 1805)              I & D    Performed by: Avionna Bower, MD  Authorized by: Lemond Griffee, MD    Consent:     Consent obtained:  Verbal    Consent given by:  Patient    Risks, benefits,  and alternatives were discussed: yes      Risks discussed:  Bleeding, incomplete drainage, pain, infection and damage to other organs    Alternatives discussed:  No treatment, delayed treatment and alternative treatment  Universal protocol:     Imaging studies available: yes      Patient identity confirmed:  Verbally with patient  Location:     Type:  Abscess    Location:  Trunk    Trunk location:  Abdomen  Pre-procedure details:     Skin preparation:  Povidone-iodine  Anesthesia:     Anesthesia method:  Local infiltration    Local anesthetic:  Lidocaine  1% WITH epi  Procedure type:     Complexity:  Complex  Procedure details:     Ultrasound guidance: yes      Needle aspiration: no      Incision types:  Single straight    Incision depth:  Subcutaneous    Wound management:  Probed and deloculated and irrigated with saline    Drainage:  Purulent    Drainage amount:  Moderate    Wound treatment:  Wound left open and drain placed    Packing materials:  1/2 in iodoform gauze    Amount 1/2 iodoform:  4 cm  Post-procedure details:     Procedure completion:  Tolerated well, no immediate complications    I performed a limited soft tissue ultrasound    Indications fluctuance    Location Abdominal Wall    Findings / Overall Impression    1) Cobblestoning is identified,  as such the soft tissue exam is positive for cobblestoning    2) A fluid collection is identified, as such the soft tissue exam is positive for a fluid collection          REEVALUATION/CONSULTS  ED Course as of 05/10/24 1907   Thu May 10, 2024   1900 Patient has been on Keflex  for several days and has had worsening redness, he has a history of recurrent abscesses, 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  He knows to continue the 2, wound culture has been sent and when he follows up with surgery they can tighten the coverage and stop one of the antibiotics   1907 Spoke with Caitlin Polistena who is on-call, she is in the office tomorrow and can do a wound check for the patient and reevaluate him in         Diagnoses as of 05/10/24 1907   Cutaneous abscess of abdominal wall   Cellulitis of right abdominal wall        Final diagnoses:   [L02.211] Cutaneous abscess of abdominal wall   [L03.311] Cellulitis of right abdominal wall        DISCHARGE PLAN  CONDITION: Stable  DISPOSITION: Discharge       Medication List      START taking these medications     cephalexin  500 mg capsule; Commonly known as: Keflex ; Take 1 capsule   (500 mg) by mouth four times daily for 10 days.             [1] No Known Allergies  [2] History reviewed. No pertinent past medical history.  [3]   Past Surgical History:  Procedure Laterality Date    CIRCUMCISION, NON-NEWBORN      age 58    WISDOM TOOTH EXTRACTION     [4] No family history on file.       Laymon Better, MD  05/10/24 [phone]

## 2024-05-10 NOTE — ED Triage Notes (Signed)
 Ambulatory to triage c/o worsening cyst to RLQ abdomen. Pt states worsening redness and discomfort. Denies drainage, denies fevers. States hx of similar in past to LLQ.

## 2024-05-11 LAB — WOUND/AEROBIC CULTURE AND GRAM STAIN
Gram Stain Result: NONE SEEN
Wound Culture: NO GROWTH

## 2024-05-11 MED ORDER — CEPHALEXIN 500 MG CAPSULE
500 | ORAL_CAPSULE | Freq: Four times a day (QID) | ORAL | 0 refills | 7.00000 days | Status: AC
Start: 2024-05-11 — End: 2024-05-20

## 2024-05-11 MED ORDER — CEPHALEXIN 250 MG CAPSULE
250 | Freq: Once | ORAL | Status: AC
Start: 2024-05-11 — End: 2024-05-10
  Administered 2024-05-11: 500 mg via ORAL

## 2024-05-11 MED FILL — CEPHALEXIN 250 MG CAPSULE: 250 250 mg | ORAL | Qty: 2 | Fill #0

## 2024-05-21 ENCOUNTER — Encounter: Payer: PRIVATE HEALTH INSURANCE | Primary: Pediatrics
